# Patient Record
Sex: Male | Born: 1985 | Race: Black or African American | Hispanic: No | Marital: Single | Smoking: Never smoker
Health system: Southern US, Community
[De-identification: ages and names within clinical notes are randomized; demographics above are authoritative.]

## PROBLEM LIST (undated history)

## (undated) HISTORY — PX: LEG SURGERY: SHX1003

---
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
## 2005-07-02 ENCOUNTER — Ambulatory Visit: Payer: Self-pay | Admitting: Family Medicine

## 2006-05-02 ENCOUNTER — Ambulatory Visit: Payer: Self-pay | Admitting: Family Medicine

## 2006-09-29 DIAGNOSIS — E669 Obesity, unspecified: Secondary | ICD-10-CM | POA: Insufficient documentation

## 2007-05-01 ENCOUNTER — Ambulatory Visit: Payer: Self-pay

## 2007-07-03 ENCOUNTER — Encounter: Payer: Self-pay | Admitting: *Deleted

## 2008-04-03 ENCOUNTER — Ambulatory Visit: Payer: Self-pay | Admitting: Family Medicine

## 2008-04-03 ENCOUNTER — Encounter: Payer: Self-pay | Admitting: Family Medicine

## 2008-04-04 LAB — CONVERTED CEMR LAB
ALT: 63 units/L — ABNORMAL HIGH (ref 0–53)
AST: 29 units/L (ref 0–37)
Albumin: 4.7 g/dL (ref 3.5–5.2)
Alkaline Phosphatase: 54 units/L (ref 39–117)
BUN: 9 mg/dL (ref 6–23)
CO2: 25 meq/L (ref 19–32)
Calcium: 9.8 mg/dL (ref 8.4–10.5)
Chloride: 103 meq/L (ref 96–112)
Cholesterol: 157 mg/dL (ref 0–200)
Creatinine, Ser: 0.84 mg/dL (ref 0.40–1.50)
Glucose, Bld: 88 mg/dL (ref 70–99)
HDL: 47 mg/dL (ref >39)
LDL Cholesterol: 91 mg/dL (ref 0–99)
Potassium: 4.8 meq/L (ref 3.5–5.3)
Sodium: 139 meq/L (ref 135–145)
Total Bilirubin: 0.6 mg/dL (ref 0.3–1.2)
Total CHOL/HDL Ratio: 3.3
Total Protein: 7.8 g/dL (ref 6.0–8.3)
Triglycerides: 96 mg/dL (ref <150)
VLDL: 19 mg/dL (ref 0–40)

## 2008-04-22 ENCOUNTER — Ambulatory Visit: Payer: Self-pay | Admitting: Family Medicine

## 2014-10-19 ENCOUNTER — Emergency Department (HOSPITAL_COMMUNITY)
Admission: EM | Admit: 2014-10-19 | Discharge: 2014-10-19 | Disposition: A | Discharge status: Home / Self Care | Payer: BLUE CROSS/BLUE SHIELD | Attending: Emergency Medicine | Admitting: Emergency Medicine

## 2014-10-19 ENCOUNTER — Emergency Department (HOSPITAL_COMMUNITY): Payer: BLUE CROSS/BLUE SHIELD

## 2014-10-19 ENCOUNTER — Encounter (HOSPITAL_COMMUNITY): Payer: Self-pay | Admitting: Emergency Medicine

## 2014-10-19 DIAGNOSIS — Z23 Encounter for immunization: Secondary | ICD-10-CM | POA: Diagnosis not present

## 2014-10-19 DIAGNOSIS — Y9389 Activity, other specified: Secondary | ICD-10-CM | POA: Insufficient documentation

## 2014-10-19 DIAGNOSIS — M545 Low back pain, unspecified: Secondary | ICD-10-CM

## 2014-10-19 DIAGNOSIS — S3992XA Unspecified injury of lower back, initial encounter: Secondary | ICD-10-CM | POA: Diagnosis not present

## 2014-10-19 DIAGNOSIS — S99911A Unspecified injury of right ankle, initial encounter: Secondary | ICD-10-CM | POA: Insufficient documentation

## 2014-10-19 DIAGNOSIS — Y998 Other external cause status: Secondary | ICD-10-CM | POA: Diagnosis not present

## 2014-10-19 DIAGNOSIS — S161XXA Strain of muscle, fascia and tendon at neck level, initial encounter: Secondary | ICD-10-CM | POA: Insufficient documentation

## 2014-10-19 DIAGNOSIS — Y92410 Unspecified street and highway as the place of occurrence of the external cause: Secondary | ICD-10-CM | POA: Diagnosis not present

## 2014-10-19 DIAGNOSIS — S199XXA Unspecified injury of neck, initial encounter: Secondary | ICD-10-CM | POA: Diagnosis present

## 2014-10-19 MED ORDER — TETANUS-DIPHTH-ACELL PERTUSSIS 5-2.5-18.5 LF-MCG/0.5 IM SUSP
0.5000 mL | Freq: Once | INTRAMUSCULAR | Status: AC
  Administered 2014-10-19: 0.5 mL via INTRAMUSCULAR
  Filled 2014-10-19: qty 0.5

## 2014-10-19 MED ORDER — IBUPROFEN 800 MG PO TABS
800.0000 mg | ORAL_TABLET | Freq: Once | ORAL | Status: AC
  Administered 2014-10-19: 800 mg via ORAL
  Filled 2014-10-19: qty 1

## 2014-10-19 MED ORDER — MORPHINE SULFATE 4 MG/ML IJ SOLN
4.0000 mg | Freq: Once | INTRAMUSCULAR | Status: AC
  Administered 2014-10-19: 4 mg via INTRAMUSCULAR
  Filled 2014-10-19: qty 1

## 2014-10-19 MED ORDER — OXYCODONE-ACETAMINOPHEN 5-325 MG PO TABS: 1.0000 | ORAL_TABLET | ORAL | Status: DC | PRN

## 2014-10-19 NOTE — Progress Notes (Signed)
Orthopedic Tech Progress Note Patient Details:  Tony MedianDamone L Trujillo 10/11/1985 [id number] Patient ID: Tony Trujillo, male   DOB: 11/24/1985, 29 y.o.   MRN: [id number] Tony Trujillo, Tony Trujillo 10/19/2014, 9:33 PM Ok to apply by Dr. Rosalia Hammersay

## 2014-10-19 NOTE — ED Notes (Signed)
Signature pad not working. Pt verbalized understanding, all questions answered by RN.
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
## 2014-10-19 NOTE — Progress Notes (Signed)
Orthopedic Tech Progress Note Patient Details:  Tony Trujillo 04/12/1986 [id number]  Ortho Devices Type of Ortho Device: CAM walker Ortho Device/Splint Location: RLE Ortho Device/Splint Interventions: Ordered, Application   Jennye MoccasinHughes, [name] 10/19/2014, 9:28 PM

## 2014-10-19 NOTE — Progress Notes (Signed)
Orthopedic Tech Progress Note Patient Details:  Tony Trujillo 07/24/1986 [id number]  Ortho Devices Type of Ortho Device: Soft collar Ortho Device/Splint Location: neck Ortho Device/Splint Interventions: Ordered, Application   Tony Trujillo, Tony Trujillo 10/19/2014, 9:32 PM
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
## 2016-07-25 ENCOUNTER — Emergency Department (HOSPITAL_COMMUNITY)
Admission: EM | Admit: 2016-07-25 | Discharge: 2016-07-25 | Disposition: A | Discharge status: Home / Self Care | Payer: Self-pay | Attending: Emergency Medicine | Admitting: Emergency Medicine

## 2016-07-25 ENCOUNTER — Emergency Department (HOSPITAL_COMMUNITY): Payer: Self-pay

## 2016-07-25 ENCOUNTER — Encounter (HOSPITAL_COMMUNITY): Payer: Self-pay | Admitting: Nurse Practitioner

## 2016-07-25 DIAGNOSIS — R55 Syncope and collapse: Secondary | ICD-10-CM | POA: Insufficient documentation

## 2016-07-25 LAB — CBC WITH DIFFERENTIAL/PLATELET
Basophils Absolute: 0 10*3/uL (ref 0.0–0.1)
Basophils Relative: 0 %
Eosinophils Absolute: 0.1 10*3/uL (ref 0.0–0.7)
Eosinophils Relative: 0 %
HCT: 46.7 % (ref 39.0–52.0)
Hemoglobin: 16.2 g/dL (ref 13.0–17.0)
Lymphocytes Relative: 13 %
Lymphs Abs: 1.9 10*3/uL (ref 0.7–4.0)
MCH: 31.3 pg (ref 26.0–34.0)
MCHC: 34.7 g/dL (ref 30.0–36.0)
MCV: 90.3 fL (ref 78.0–100.0)
Monocytes Absolute: 1.1 10*3/uL — ABNORMAL HIGH (ref 0.1–1.0)
Monocytes Relative: 7 %
Neutro Abs: 11.7 10*3/uL — ABNORMAL HIGH (ref 1.7–7.7)
Neutrophils Relative %: 80 %
Platelets: 350 10*3/uL (ref 150–400)
RBC: 5.17 MIL/uL (ref 4.22–5.81)
RDW: 12.3 % (ref 11.5–15.5)
WBC: 14.8 10*3/uL — ABNORMAL HIGH (ref 4.0–10.5)

## 2016-07-25 LAB — BASIC METABOLIC PANEL
Anion gap: 8 (ref 5–15)
BUN: 11 mg/dL (ref 6–20)
CO2: 24 mmol/L (ref 22–32)
Calcium: 9.1 mg/dL (ref 8.9–10.3)
Chloride: 105 mmol/L (ref 101–111)
Creatinine, Ser: 1.01 mg/dL (ref 0.61–1.24)
GFR calc Af Amer: >60 mL/min (ref >60)
GFR calc non Af Amer: >60 mL/min (ref >60)
Glucose, Bld: 82 mg/dL (ref 65–99)
Potassium: 3.7 mmol/L (ref 3.5–5.1)
Sodium: 137 mmol/L (ref 135–145)

## 2016-07-25 LAB — D-DIMER, QUANTITATIVE: D-Dimer, Quant: 1.62 ug/mL-FEU — ABNORMAL HIGH (ref 0.00–0.50)

## 2016-07-25 MED ORDER — SODIUM CHLORIDE 0.9 % IV BOLUS (SEPSIS)
1000.0000 mL | Freq: Once | INTRAVENOUS | Status: AC
  Administered 2016-07-25: 1000 mL via INTRAVENOUS

## 2016-07-25 MED ORDER — IOPAMIDOL (ISOVUE-370) INJECTION 76%
100.0000 mL | Freq: Once | INTRAVENOUS | Status: AC | PRN
  Administered 2016-07-25: 100 mL via INTRAVENOUS

## 2016-07-25 MED ORDER — IOPAMIDOL (ISOVUE-370) INJECTION 76%
INTRAVENOUS | Status: AC
  Administered 2016-07-25: 100 mL via INTRAVENOUS
  Administered 2016-07-25: 18:00:00
  Filled 2016-07-25: qty 100

## 2016-07-25 MED ORDER — ACETAMINOPHEN 500 MG PO TABS
1000.0000 mg | ORAL_TABLET | Freq: Once | ORAL | Status: AC
  Administered 2016-07-25: 1000 mg via ORAL
  Filled 2016-07-25: qty 2
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
## 2016-10-10 ENCOUNTER — Emergency Department (HOSPITAL_COMMUNITY)
Admission: EM | Admit: 2016-10-10 | Discharge: 2016-10-10 | Disposition: A | Discharge status: Home / Self Care | Payer: BLUE CROSS/BLUE SHIELD | Attending: Emergency Medicine | Admitting: Emergency Medicine

## 2016-10-10 ENCOUNTER — Encounter (HOSPITAL_COMMUNITY): Payer: Self-pay | Admitting: Emergency Medicine

## 2016-10-10 ENCOUNTER — Emergency Department (HOSPITAL_COMMUNITY): Payer: BLUE CROSS/BLUE SHIELD

## 2016-10-10 DIAGNOSIS — Z79899 Other long term (current) drug therapy: Secondary | ICD-10-CM | POA: Insufficient documentation

## 2016-10-10 DIAGNOSIS — R1013 Epigastric pain: Secondary | ICD-10-CM | POA: Insufficient documentation

## 2016-10-10 DIAGNOSIS — R1010 Upper abdominal pain, unspecified: Secondary | ICD-10-CM

## 2016-10-10 DIAGNOSIS — R748 Abnormal levels of other serum enzymes: Secondary | ICD-10-CM | POA: Insufficient documentation

## 2016-10-10 LAB — HEPATIC FUNCTION PANEL
ALT: 23 U/L (ref 17–63)
AST: 20 U/L (ref 15–41)
Albumin: 4.1 g/dL (ref 3.5–5.0)
Alkaline Phosphatase: 34 U/L — ABNORMAL LOW (ref 38–126)
BILIRUBIN TOTAL: 0.3 mg/dL (ref 0.3–1.2)
Total Protein: 7.3 g/dL (ref 6.5–8.1)

## 2016-10-10 LAB — I-STAT TROPONIN, ED: TROPONIN I, POC: 0 ng/mL (ref 0.00–0.08)

## 2016-10-10 LAB — BASIC METABOLIC PANEL
Anion gap: 5 (ref 5–15)
BUN: 11 mg/dL (ref 6–20)
CALCIUM: 9 mg/dL (ref 8.9–10.3)
CO2: 24 mmol/L (ref 22–32)
Chloride: 106 mmol/L (ref 101–111)
Creatinine, Ser: 0.8 mg/dL (ref 0.61–1.24)
GFR calc Af Amer: >60 mL/min (ref >60)
GLUCOSE: 112 mg/dL — AB (ref 65–99)
Potassium: 3.6 mmol/L (ref 3.5–5.1)
Sodium: 135 mmol/L (ref 135–145)

## 2016-10-10 LAB — CBC
HCT: 41 % (ref 39.0–52.0)
Hemoglobin: 13.9 g/dL (ref 13.0–17.0)
MCH: 30.7 pg (ref 26.0–34.0)
MCHC: 33.9 g/dL (ref 30.0–36.0)
MCV: 90.5 fL (ref 78.0–100.0)
Platelets: 287 10*3/uL (ref 150–400)
RBC: 4.53 MIL/uL (ref 4.22–5.81)
RDW: 12.5 % (ref 11.5–15.5)
WBC: 7.5 10*3/uL (ref 4.0–10.5)

## 2016-10-10 LAB — URINALYSIS, ROUTINE W REFLEX MICROSCOPIC
Bilirubin Urine: NEGATIVE
GLUCOSE, UA: NEGATIVE mg/dL
HGB URINE DIPSTICK: NEGATIVE
Ketones, ur: NEGATIVE mg/dL
Leukocytes, UA: NEGATIVE
Nitrite: NEGATIVE
PH: 5 (ref 5.0–8.0)
PROTEIN: NEGATIVE mg/dL
Specific Gravity, Urine: 1.017 (ref 1.005–1.030)

## 2016-10-10 LAB — LIPASE, BLOOD: Lipase: 118 U/L — ABNORMAL HIGH (ref 11–51)

## 2016-10-10 MED ORDER — ALUM & MAG HYDROXIDE-SIMETH 200-200-20 MG/5ML PO SUSP
30.0000 mL | Freq: Once | ORAL | Status: AC
  Administered 2016-10-10: 30 mL via ORAL
  Filled 2016-10-10: qty 30

## 2016-10-10 MED ORDER — FAMOTIDINE 20 MG PO TABS
20.0000 mg | ORAL_TABLET | Freq: Once | ORAL | Status: AC
  Administered 2016-10-10: 20 mg via ORAL
  Filled 2016-10-10: qty 1

## 2016-10-10 MED ORDER — ACETAMINOPHEN 500 MG PO TABS
1000.0000 mg | ORAL_TABLET | Freq: Once | ORAL | Status: AC
  Administered 2016-10-10: 1000 mg via ORAL
  Filled 2016-10-10: qty 2

## 2016-10-10 NOTE — ED Notes (Signed)
Patient has been given urinal and told to call out when sample it ready.

## 2016-10-10 NOTE — ED Notes (Signed)
Patient denies pain and is resting comfortably.  

## 2016-10-10 NOTE — ED Triage Notes (Signed)
Pt c/o constant tight pain from lower chest to lower abdomen onset this morning, some SOB and chest tightness worse with ambulation on waking that resolved. No n/v/diarrhea.

## 2016-10-10 NOTE — ED Provider Notes (Signed)
[facility]-EMERGENCY DEPT Provider Note   CSN: [id number] Arrival date & time: 10/10/16  0803     History   Chief Complaint Chief Complaint  Patient presents with  . Abdominal Pain    HPI Tony Trujillo is a 31 y.o. male.  Patient c/o abdominal pain, from epigastric area to mid abd onset this AM.  Gradual onset, dull, moderate, non radiating. No hx same pain. w pain, no specific exacerbating or alleviating factors. No back or flank pain. No chest pain or sob. No hx pud, pancreatitis, or gallstones. No prior abd surgery. Had normal bm today. No dysuria, hematuria or gu c/o. No fever or chills.    The history is provided by the patient.    History reviewed. No pertinent past medical history.  Patient Active Problem List   Diagnosis Date Noted  . OBESITY, NOS 09/29/2006    History reviewed. No pertinent surgical history.     Home Medications    Prior to Admission medications   Medication Sig Start Date End Date Taking? Authorizing Provider  ibuprofen (ADVIL,MOTRIN) 200 MG tablet Take 200-600 mg by mouth every 6 (six) hours as needed for headache, mild pain or moderate pain.    Yes Historical Provider, MD    Family History History reviewed. No pertinent family history.  Social History Social History  Substance Use Topics  . Smoking status: Never Smoker  . Smokeless tobacco: Never Used  . Alcohol use No     Allergies   Patient has no known allergies.   Review of Systems Review of Systems  Constitutional: Negative for fever.  HENT: Negative for sore throat.   Eyes: Negative for redness.  Respiratory: Negative for shortness of breath.   Cardiovascular: Negative for chest pain and leg swelling.  Gastrointestinal: Positive for abdominal pain. Negative for constipation, diarrhea and vomiting.  Genitourinary: Negative for dysuria and flank pain.  Musculoskeletal: Negative for back pain and neck pain.  Skin: Negative for rash.  Neurological: Negative for headaches.    Hematological: Does not bruise/bleed easily.  Psychiatric/Behavioral: Negative for confusion.     Physical Exam Updated Vital Signs BP 103/79 (BP Location: Left Arm)   Pulse 76   Temp 98.2 F (36.8 C) (Oral)   Resp 16   Wt (!) 138.8 kg   SpO2 100%   BMI 41.50 kg/m   Physical Exam  Constitutional: He appears well-developed and well-nourished. No distress.  HENT:  Mouth/Throat: Oropharynx is clear and moist.  Eyes: Conjunctivae are normal.  Neck: Neck supple. No tracheal deviation present.  Cardiovascular: Normal rate, regular rhythm, normal heart sounds and intact distal pulses.  Exam reveals no gallop and no friction rub.   No murmur heard. Pulmonary/Chest: Effort normal and breath sounds normal. No accessory muscle usage. No respiratory distress.  Abdominal: Soft. Bowel sounds are normal. He exhibits no distension and no mass. There is tenderness. There is no rebound and no guarding. No hernia.  Mild epigastric tenderness  Genitourinary:  Genitourinary Comments: No cva tenderness  Musculoskeletal: He exhibits no edema or tenderness.  Neurological: He is alert.  Skin: Skin is warm and dry. He is not diaphoretic.  Psychiatric: He has a normal mood and affect.  Nursing note and vitals reviewed.    ED Treatments / Results  Labs (all labs ordered are listed, but only abnormal results are displayed) Results for orders placed or performed during the hospital encounter of 10/10/16  Basic metabolic panel  Result Value Ref Range   Sodium 135 135 -  145 mmol/L   Potassium 3.6 3.5 - 5.1 mmol/L   Chloride 106 101 - 111 mmol/L   CO2 24 22 - 32 mmol/L   Glucose, Bld 112 (H) 65 - 99 mg/dL   BUN 11 6 - 20 mg/dL   Creatinine, Ser 1.61 0.61 - 1.24 mg/dL   Calcium 9.0 8.9 - 09.6 mg/dL   GFR calc non Af Amer >60 >60 mL/min   GFR calc Af Amer >60 >60 mL/min   Anion gap 5 5 - 15  CBC  Result Value Ref Range   WBC 7.5 4.0 - 10.5 K/uL   RBC 4.53 4.22 - 5.81 MIL/uL   Hemoglobin  13.9 13.0 - 17.0 g/dL   HCT 04.5 40.9 - 81.1 %   MCV 90.5 78.0 - 100.0 fL   MCH 30.7 26.0 - 34.0 pg   MCHC 33.9 30.0 - 36.0 g/dL   RDW 91.4 78.2 - 95.6 %   Platelets 287 150 - 400 K/uL  Urinalysis, Routine w reflex microscopic  Result Value Ref Range   Color, Urine YELLOW YELLOW   APPearance CLEAR CLEAR   Specific Gravity, Urine 1.017 1.005 - 1.030   pH 5.0 5.0 - 8.0   Glucose, UA NEGATIVE NEGATIVE mg/dL   Hgb urine dipstick NEGATIVE NEGATIVE   Bilirubin Urine NEGATIVE NEGATIVE   Ketones, ur NEGATIVE NEGATIVE mg/dL   Protein, ur NEGATIVE NEGATIVE mg/dL   Nitrite NEGATIVE NEGATIVE   Leukocytes, UA NEGATIVE NEGATIVE  Hepatic function panel  Result Value Ref Range   Total Protein 7.3 6.5 - 8.1 g/dL   Albumin 4.1 3.5 - 5.0 g/dL   AST 20 15 - 41 U/L   ALT 23 17 - 63 U/L   Alkaline Phosphatase 34 (L) 38 - 126 U/L   Total Bilirubin 0.3 0.3 - 1.2 mg/dL   Bilirubin, Direct <2.1 (L) 0.1 - 0.5 mg/dL   Indirect Bilirubin NOT CALCULATED 0.3 - 0.9 mg/dL  Lipase, blood  Result Value Ref Range   Lipase 118 (H) 11 - 51 U/L  I-stat troponin, ED  Result Value Ref Range   Troponin i, poc 0.00 0.00 - 0.08 ng/mL   Comment 3           Dg Chest 2 View  Result Date: 10/10/2016 CLINICAL DATA:  Chest pain EXAM: CHEST  2 VIEW COMPARISON:  07/25/2016 FINDINGS: The heart size and mediastinal contours are within normal limits. Both lungs are clear. The visualized skeletal structures are unremarkable. IMPRESSION: No active cardiopulmonary disease. Electronically Signed   By: Alcide Clever M.D.   On: 10/10/2016 08:44    EKG  EKG Interpretation  Date/Time:  Sunday October 10 2016 08:15:19 EDT Ventricular Rate:  81 PR Interval:    QRS Duration: 85 QT Interval:  345 QTC Calculation: 401 R Axis:   17 Text Interpretation:  Sinus rhythm No significant change since last tracing Confirmed by Denton Lank  MD, Caryn Bee ([id number]) on 10/10/2016 8:24:11 AM       Radiology Dg Chest 2 View  Result Date:  10/10/2016 CLINICAL DATA:  Chest pain EXAM: CHEST  2 VIEW COMPARISON:  07/25/2016 FINDINGS: The heart size and mediastinal contours are within normal limits. Both lungs are clear. The visualized skeletal structures are unremarkable. IMPRESSION: No active cardiopulmonary disease. Electronically Signed   By: Alcide Clever M.D.   On: 10/10/2016 08:44    Procedures Procedures (including critical care time)  Medications Ordered in ED Medications  famotidine (PEPCID) tablet 20 mg (not administered)  acetaminophen (TYLENOL)  tablet 1,000 mg (not administered)  alum & mag hydroxide-simeth (MAALOX/MYLANTA) 200-200-20 MG/5ML suspension 30 mL (not administered)     Initial Impression / Assessment and Plan / ED Course  I have reviewed the triage vital signs and the nursing notes.  Pertinent labs & imaging results that were available during my care of the patient were reviewed by me and considered in my medical decision making (see chart for details).  Labs sent.  Tylenol, pepcid, and maalox given for symptom relief.  Reviewed nursing notes and prior charts for additional history.   Patient with resolution symptoms.  From labs, lipase is mildly elevated. Current no abd or back pain.   Today, clear liquids, advance as tolerated.   Rec pcp f/u.      Final Clinical Impressions(s) / ED Diagnoses   Final diagnoses:  None    New Prescriptions New Prescriptions   No medications on file     Cathren LaineKevin Fama Muenchow, MD 10/10/16 1227

## 2016-10-10 NOTE — Discharge Instructions (Signed)
It was our pleasure to provide your ER care today - we hope that you feel better.  Rest. Drink adequate fluids.  Clear liquid diet today, then advance as tolerated.  You may take acetaminophen or ibuprofen as need.  If gi symptoms, you may also try pepcid and/or maalox as need.   From today's labs, one of your pancreas tests is mildly elevated (lipase 118) - follow up with primary care doctor.  Follow up with your doctor in the next 1-2 days if symptoms fail to improve/resolve.  Return to ER right away if worse, new symptoms, fevers, worsening or severe abdominal pain, persistent vomiting, other concern.

## 2021-01-17 ENCOUNTER — Encounter (HOSPITAL_BASED_OUTPATIENT_CLINIC_OR_DEPARTMENT_OTHER): Payer: Self-pay | Admitting: *Deleted

## 2021-01-17 ENCOUNTER — Emergency Department (HOSPITAL_BASED_OUTPATIENT_CLINIC_OR_DEPARTMENT_OTHER): Payer: BLUE CROSS/BLUE SHIELD

## 2021-01-17 ENCOUNTER — Other Ambulatory Visit: Payer: Self-pay

## 2021-01-17 ENCOUNTER — Emergency Department (HOSPITAL_BASED_OUTPATIENT_CLINIC_OR_DEPARTMENT_OTHER)
Admission: EM | Admit: 2021-01-17 | Discharge: 2021-01-17 | Disposition: A | Discharge status: Home / Self Care | Payer: BLUE CROSS/BLUE SHIELD | Attending: Emergency Medicine | Admitting: Emergency Medicine

## 2021-01-17 DIAGNOSIS — W1789XA Other fall from one level to another, initial encounter: Secondary | ICD-10-CM | POA: Insufficient documentation

## 2021-01-17 DIAGNOSIS — S92351A Displaced fracture of fifth metatarsal bone, right foot, initial encounter for closed fracture: Secondary | ICD-10-CM | POA: Insufficient documentation

## 2021-01-17 DIAGNOSIS — T1490XA Injury, unspecified, initial encounter: Secondary | ICD-10-CM

## 2021-01-17 MED ORDER — OXYCODONE HCL 5 MG PO TABS: 5.0000 mg | ORAL_TABLET | ORAL | 0 refills | Status: AC | PRN

## 2021-01-17 MED ORDER — OXYCODONE-ACETAMINOPHEN 5-325 MG PO TABS
1.0000 | ORAL_TABLET | Freq: Once | ORAL | Status: AC
  Administered 2021-01-17: 1 via ORAL
  Filled 2021-01-17: qty 1

## 2021-01-17 MED ORDER — IBUPROFEN 400 MG PO TABS
600.0000 mg | ORAL_TABLET | Freq: Once | ORAL | Status: AC
  Administered 2021-01-17: 600 mg via ORAL
  Filled 2021-01-17: qty 1

## 2021-01-17 NOTE — Discharge Instructions (Signed)
Do not bear weight on your right leg.  Follow-up with the orthopedist either on 6/20 or 6/22.  Call on 6/20 for an appointment.

## 2021-01-17 NOTE — ED Provider Notes (Signed)
[facility] EMERGENCY DEPT Provider Note   CSN: [id number] Arrival date & time: 01/17/21  1515     History Chief Complaint  Patient presents with   Foot Injury    Tony Trujillo is a 35 y.o. male.  HPI 35 year old male presents with right foot injury.  Yesterday he was getting off of a trailer and the stepstool going to the trailer broke and he twisted his right foot.  Has been unable to bear weight on it since and has to slide around.  No weakness or numbness but is swollen and painful.  Took some ibuprofen yesterday.  History reviewed. No pertinent past medical history.  Patient Active Problem List   Diagnosis Date Noted   OBESITY, NOS 09/29/2006    Past Surgical History:  Procedure Laterality Date   LEG SURGERY Left        No family history on file.  Social History   Tobacco Use   Smoking status: Never   Smokeless tobacco: Never  Substance Use Topics   Alcohol use: No   Drug use: Never    Home Medications Prior to Admission medications   Medication Sig Start Date End Date Taking? Authorizing Provider  oxyCODONE (ROXICODONE) 5 MG immediate release tablet Take 1-2 tablets (5-10 mg total) by mouth every 4 (four) hours as needed for severe pain. 01/17/21  Yes Pricilla Loveless, MD  ibuprofen (ADVIL,MOTRIN) 200 MG tablet Take 200-600 mg by mouth every 6 (six) hours as needed for headache, mild pain or moderate pain.     [provider]    Allergies    Patient has no known allergies.  Review of Systems   Review of Systems  Musculoskeletal:  Positive for arthralgias and joint swelling.  Neurological:  Negative for weakness and numbness.   Physical Exam Updated Vital Signs BP 122/69 (BP Location: Left Arm)   Pulse 84   Temp 98.4 F (36.9 C) (Oral)   Resp 17   Ht 6' (1.829 m)   Wt 126.6 kg   SpO2 96%   BMI 37.84 kg/m   Physical Exam Vitals and nursing note reviewed.  Constitutional:      Appearance: He is well-developed.  HENT:      Head: Normocephalic and atraumatic.     Right Ear: External ear normal.     Left Ear: External ear normal.     Nose: Nose normal.  Eyes:     General:        Right eye: No discharge.        Left eye: No discharge.  Cardiovascular:     Rate and Rhythm: Normal rate and regular rhythm.     Pulses:          Dorsalis pedis pulses are 2+ on the right side.  Pulmonary:     Effort: Pulmonary effort is normal.  Abdominal:     General: There is no distension.  Musculoskeletal:     Cervical back: Neck supple.     Right ankle: No swelling or ecchymosis. No tenderness. Normal range of motion.     Right foot: Swelling and tenderness (lateral) present. No deformity.  Skin:    General: Skin is warm and dry.  Neurological:     Mental Status: He is alert.  Psychiatric:        Mood and Affect: Mood is not anxious.    ED Results / Procedures / Treatments   Labs (all labs ordered are listed, but only abnormal results are displayed) Labs  Reviewed - No data to display  EKG None  Radiology DG Foot Complete Right  Result Date: 01/17/2021 CLINICAL DATA:  Twisted right foot at work. EXAM: RIGHT FOOT COMPLETE - 3+ VIEW COMPARISON:  None. FINDINGS: There is a mildly comminuted and displaced fracture of the base of the right fifth metatarsal bone. Associated soft tissue swelling. IMPRESSION: Mildly comminuted and displaced fracture of the base of the right fifth metatarsal bone. Electronically Signed   By: Ted Mcalpine M.D.   On: 01/17/2021 17:17    Procedures Procedures   Medications Ordered in ED Medications  ibuprofen (ADVIL) tablet 600 mg (600 mg Oral Given 01/17/21 1805)  oxyCODONE-acetaminophen (PERCOCET/ROXICET) 5-325 MG per tablet 1 tablet (1 tablet Oral Given 01/17/21 1805)    ED Course  I have reviewed the triage vital signs and the nursing notes.  Pertinent labs & imaging results that were available during my care of the patient were reviewed by me and considered in my  medical decision making (see chart for details).    MDM Rules/Calculators/A&P                          Patient's x-rays have been reviewed and shows a proximal fifth metatarsal fracture.  Neurovascular intact.  We will give pain control and put in splint and give crutches.  We discussed no weightbearing.  Dr. Dion Saucier would like to see either on 6/20 or 6/22 in his office. Final Clinical Impression(s) / ED Diagnoses Final diagnoses:  Injury  Closed displaced fracture of fifth metatarsal bone of right foot, initial encounter    Rx / DC Orders ED Discharge Orders          Ordered    oxyCODONE (ROXICODONE) 5 MG immediate release tablet  Every 4 hours PRN        01/17/21 1808             Pricilla Loveless, MD 01/17/21 1817

## 2021-01-17 NOTE — ED Triage Notes (Signed)
Pt was stepping out of a freezer trailer yesterday, step gave way, pt twisted rt ankle as he landed on ground.Pain is more on top and lat foot today.

## 2021-06-28 ENCOUNTER — Other Ambulatory Visit: Payer: Self-pay

## 2021-06-28 ENCOUNTER — Encounter (HOSPITAL_BASED_OUTPATIENT_CLINIC_OR_DEPARTMENT_OTHER): Payer: Self-pay

## 2021-06-28 ENCOUNTER — Emergency Department (HOSPITAL_BASED_OUTPATIENT_CLINIC_OR_DEPARTMENT_OTHER)
Admission: EM | Admit: 2021-06-28 | Discharge: 2021-06-28 | Disposition: A | Discharge status: Home / Self Care | Payer: Self-pay | Attending: Emergency Medicine | Admitting: Emergency Medicine

## 2021-06-28 DIAGNOSIS — B349 Viral infection, unspecified: Secondary | ICD-10-CM | POA: Insufficient documentation

## 2021-06-28 DIAGNOSIS — Z20822 Contact with and (suspected) exposure to covid-19: Secondary | ICD-10-CM | POA: Insufficient documentation

## 2021-06-28 DIAGNOSIS — Z28310 Unvaccinated for covid-19: Secondary | ICD-10-CM | POA: Insufficient documentation

## 2021-06-28 LAB — RESP PANEL BY RT-PCR (FLU A&B, COVID) ARPGX2
Influenza A by PCR: NEGATIVE
Influenza B by PCR: NEGATIVE
SARS Coronavirus 2 by RT PCR: NEGATIVE

## 2021-06-28 NOTE — Discharge Instructions (Signed)
Likely a viral infection, recommend over-the-counter pain medications like ibuprofen Tylenol for fever and pain control, nasal decongestions like Flonase and Zyrtec, Mucinex for cough.  If not eating recommend supplementing with Gatorade to help with electrolyte supplementation.  Your COVID and your influenza came back negative.  Follow-up PCP for further evaluation.  Come back to the emergency department if you develop chest pain, shortness of breath, severe abdominal pain, uncontrolled nausea, vomiting, diarrhea.

## 2021-06-28 NOTE — ED Provider Notes (Signed)
[place] EMERGENCY DEPT Provider Note   CSN: [id number] Arrival date & time: 06/28/21  1723     History Chief Complaint  Patient presents with   Cough   Fatigue    Tony Trujillo is a 35 y.o. male.  HPI  Patient with no significant history presents with complaints of URI-like symptoms.  He endorses symptoms started on Friday and have slightly gotten better.  He endorses subjective fevers, chills, nasal congestion, productive cough, chest pain shortness of breath only while coughing, and general body aches.  He denies stomach pain, nausea, vomit, diarrhea, denies throat pain, decrease in appetite, is tolerating p.o. without difficulty.  He is not immunocompromise, has received his COVID-vaccine has not gotten his flu vaccine, states that his coworkers have been sick with a viral-like illness.  He denies  alleviating or aggravating factors.  Has no other complaints.  History reviewed. No pertinent past medical history.  Patient Active Problem List   Diagnosis Date Noted   OBESITY, NOS 09/29/2006    Past Surgical History:  Procedure Laterality Date   LEG SURGERY Left        No family history on file.  Social History   Tobacco Use   Smoking status: Never   Smokeless tobacco: Never  Substance Use Topics   Alcohol use: No   Drug use: Never    Home Medications Prior to Admission medications   Medication Sig Start Date End Date Taking? Authorizing Provider  ibuprofen (ADVIL,MOTRIN) 200 MG tablet Take 200-600 mg by mouth every 6 (six) hours as needed for headache, mild pain or moderate pain.     [provider]  oxyCODONE (ROXICODONE) 5 MG immediate release tablet Take 1-2 tablets (5-10 mg total) by mouth every 4 (four) hours as needed for severe pain. 01/17/21   Sherwood Gambler, MD    Allergies    Patient has no known allergies.  Review of Systems   Review of Systems  Constitutional:  Positive for chills and fever.  HENT:  Positive for  congestion. Negative for sore throat.   Eyes:  Negative for visual disturbance.  Respiratory:  Positive for cough. Negative for shortness of breath.   Cardiovascular:  Negative for chest pain.  Gastrointestinal:  Negative for abdominal pain, diarrhea, nausea and vomiting.  Genitourinary:  Negative for enuresis.  Musculoskeletal:  Positive for myalgias. Negative for back pain.  Skin:  Negative for rash.  Neurological:  Negative for dizziness and headaches.  Hematological:  Does not bruise/bleed easily.   Physical Exam Updated Vital Signs BP (!) 141/79 (BP Location: Right Arm)   Pulse 82   Temp 98.3 F (36.8 C)   Resp 19   SpO2 97%   Physical Exam Vitals and nursing note reviewed.  Constitutional:      General: He is not in acute distress.    Appearance: He is not ill-appearing.  HENT:     Head: Normocephalic and atraumatic.     Nose: Congestion present.     Mouth/Throat:     Mouth: Mucous membranes are moist.     Pharynx: Oropharynx is clear. No oropharyngeal exudate or posterior oropharyngeal erythema.  Eyes:     Conjunctiva/sclera: Conjunctivae normal.  Cardiovascular:     Rate and Rhythm: Normal rate and regular rhythm.     Pulses: Normal pulses.     Heart sounds: No murmur heard.   No friction rub. No gallop.  Pulmonary:     Effort: No respiratory distress.     Breath  sounds: No wheezing, rhonchi or rales.  Abdominal:     Palpations: Abdomen is soft.     Tenderness: There is no right CVA tenderness or left CVA tenderness.  Musculoskeletal:     Right lower leg: No edema.     Left lower leg: No edema.  Skin:    General: Skin is warm and dry.  Neurological:     Mental Status: He is alert.  Psychiatric:        Mood and Affect: Mood normal.    ED Results / Procedures / Treatments   Labs (all labs ordered are listed, but only abnormal results are displayed) Labs Reviewed  RESP PANEL BY RT-PCR (FLU A&B, COVID) [id number]    EKG None  Radiology No results  found.  Procedures Procedures   Medications Ordered in ED Medications - No data to display  ED Course  I have reviewed the triage vital signs and the nursing notes.  Pertinent labs & imaging results that were available during my care of the patient were reviewed by me and considered in my medical decision making (see chart for details).    MDM Rules/Calculators/A&P                          Initial impression-presents with URI-like symptoms, alert, no acute stress, vital signs reassuring.  Triage obtain respiratory panel.  Work-up-respiratory panel negative for acute findings.  Rule out- Low suspicion for systemic infection as patient is nontoxic-appearing, vital signs reassuring, no obvious source infection noted on exam.  Low suspicion for pneumonia as lung sounds are clear bilaterally, will defer imaging at this time.  I have low suspicion for PE as patient denies pleuritic chest pain, shortness of breath, patient is PERC. low suspicion for strep throat as oropharynx was visualized, no erythema or exudates noted.  Low suspicion patient would need  hospitalized due to viral infection or Covid as vital signs reassuring, patient is not in respiratory distress.    Plan-  URI-likely viral, will recommend symptom management, follow-up PCP for further evaluation.  Gave strict return precautions.  Vital signs have remained stable, no indication for hospital admission.    Patient given at home care as well strict return precautions.  Patient verbalized that they understood agreed to said plan.  Final Clinical Impression(s) / ED Diagnoses Final diagnoses:  Viral illness    Rx / DC Orders ED Discharge Orders     None        Carroll Sage, PA-C 06/28/21 1952    Gwyneth Sprout, MD 07/02/21 1335

## 2021-06-28 NOTE — ED Triage Notes (Signed)
Pt presents with a cough, dizziness and weakness starting Friday. Taking cough syrup x1 day w/no relieve. Highest temp 99.3 at home.  Pt works at [employer]

## 2022-10-23 IMAGING — DX DG FOOT COMPLETE 3+V*R*
2 series · 3 of 3 positions shown · non-contrast
Comparison: None.

CLINICAL DATA: Twisted right foot at work.

EXAM:
RIGHT FOOT COMPLETE - 3+ VIEW

[Series 1: foot · 0.14mm/px · 2 of 2 slices shown]
[im 1/2]
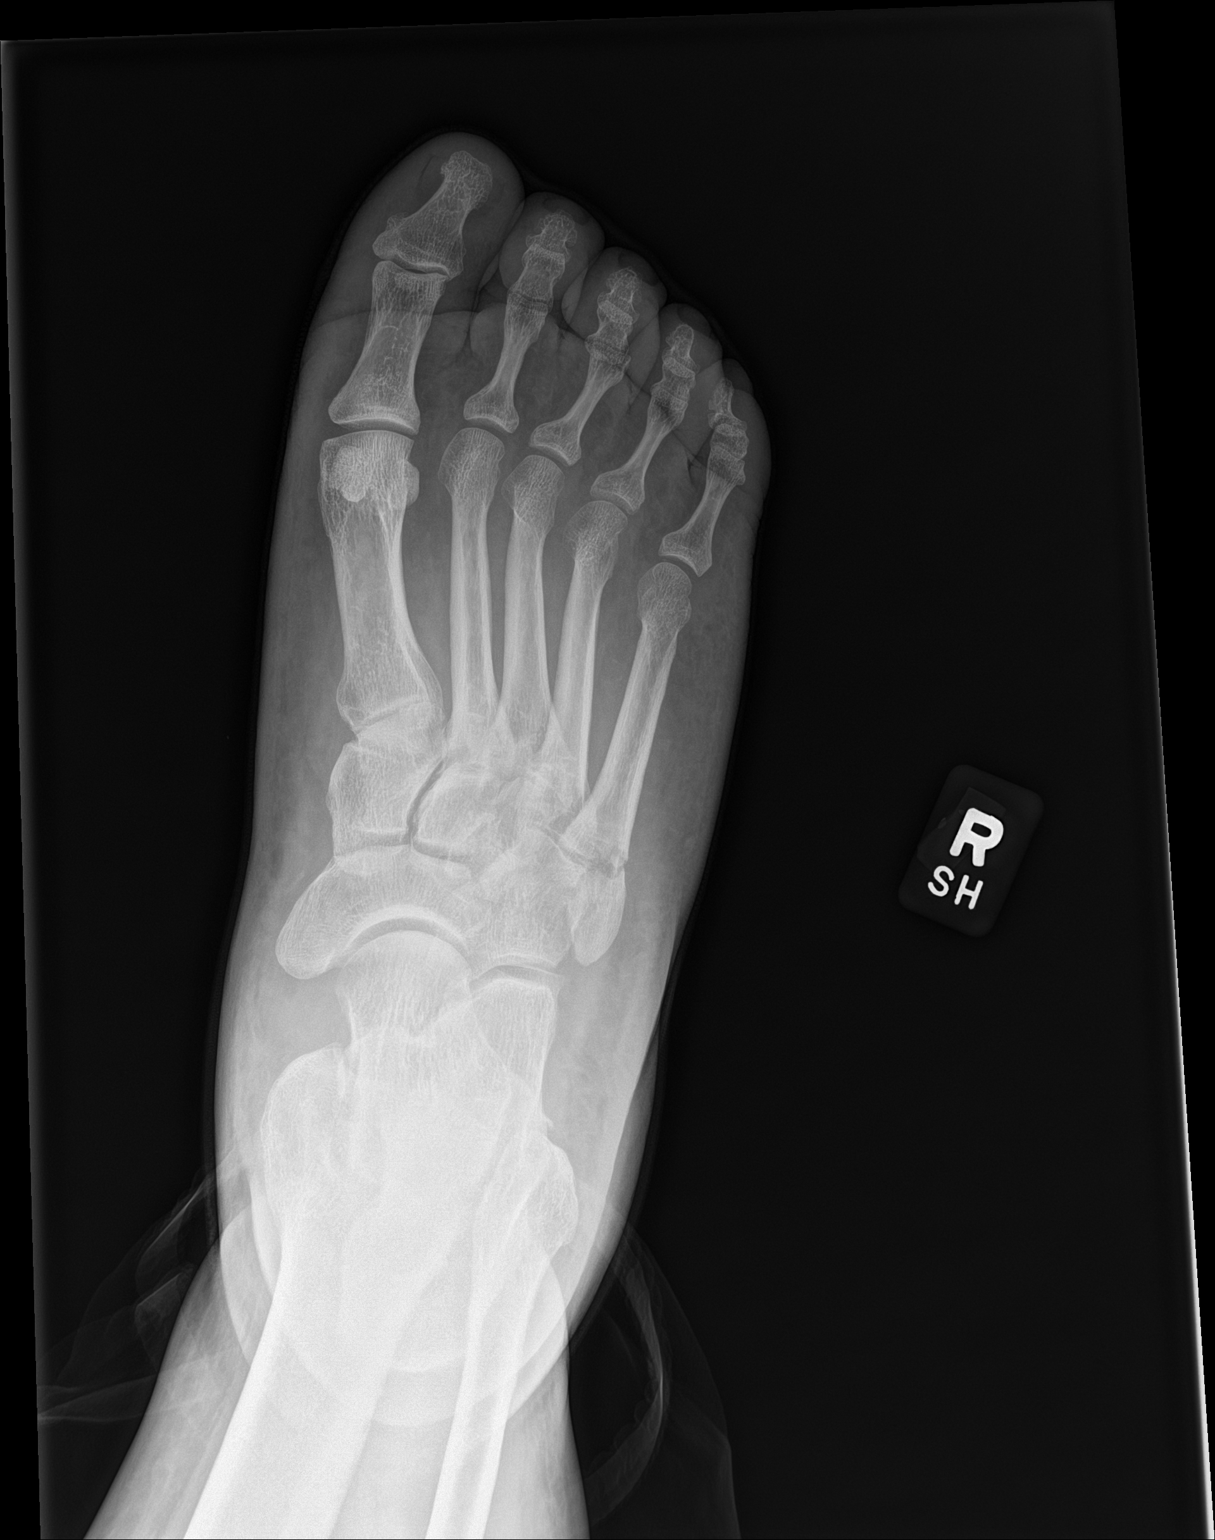
[im 2/2]
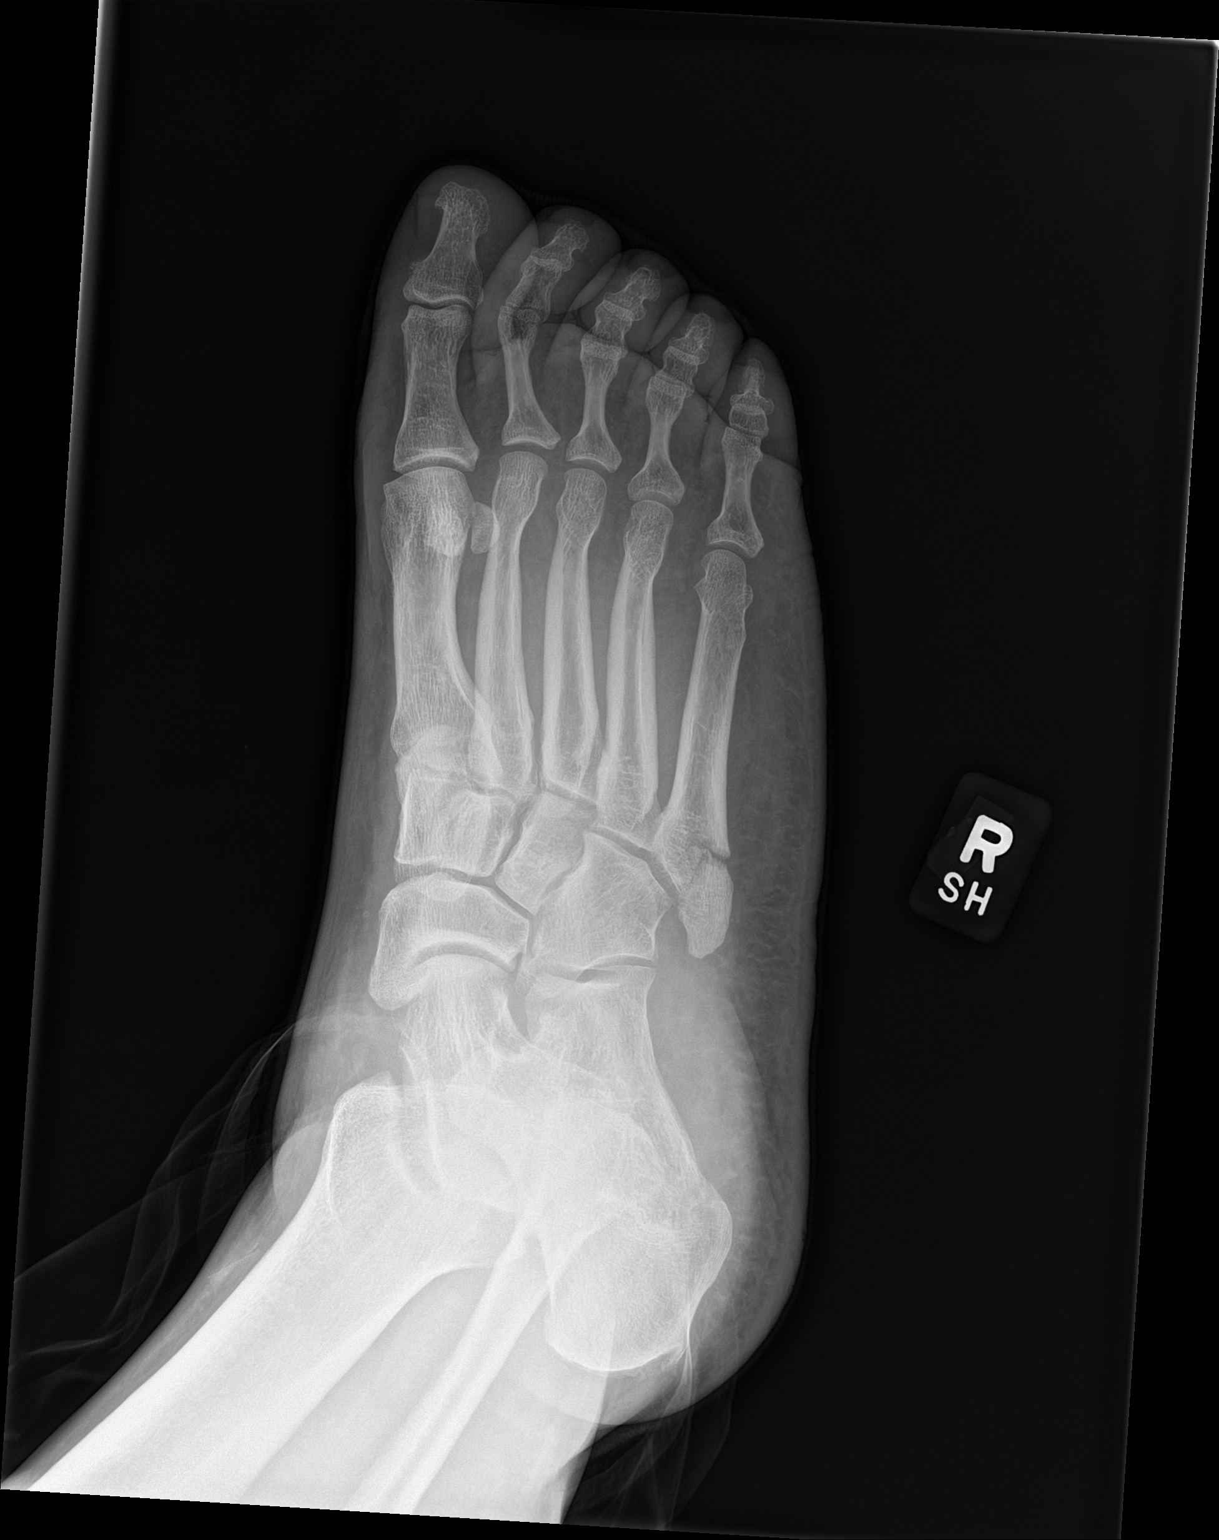

[leg]
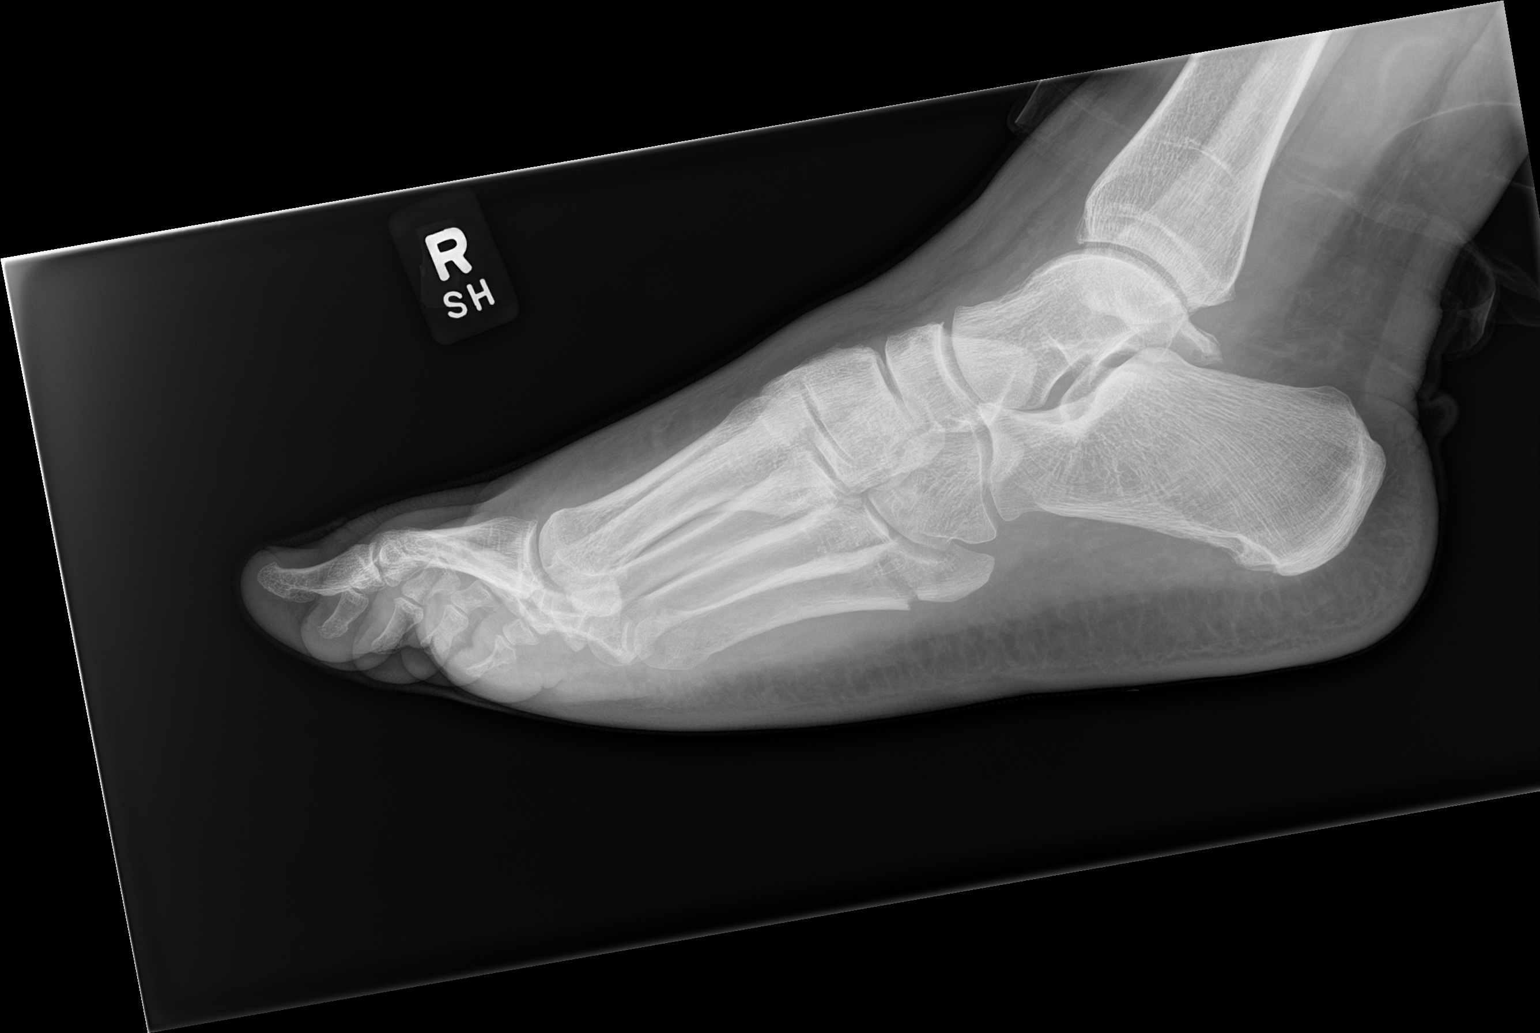

[3 of 3 positions shown; findings below may reference images not displayed]

FINDINGS: There is a mildly comminuted and displaced fracture of the base of
the right fifth metatarsal bone. Associated soft tissue swelling.
IMPRESSION: Mildly comminuted and displaced fracture of the base of the right
fifth metatarsal bone.

## 2023-04-11 ENCOUNTER — Other Ambulatory Visit: Payer: Self-pay

## 2023-04-11 ENCOUNTER — Emergency Department (HOSPITAL_BASED_OUTPATIENT_CLINIC_OR_DEPARTMENT_OTHER)
Admission: EM | Admit: 2023-04-11 | Discharge: 2023-04-11 | Disposition: A | Discharge status: Home / Self Care | Payer: Self-pay

## 2023-04-11 DIAGNOSIS — K029 Dental caries, unspecified: Secondary | ICD-10-CM | POA: Insufficient documentation

## 2023-04-11 MED ORDER — OXYCODONE-ACETAMINOPHEN 5-325 MG PO TABS
1.0000 | ORAL_TABLET | Freq: Once | ORAL | Status: AC
  Administered 2023-04-11: 1 via ORAL
  Filled 2023-04-11: qty 1

## 2023-04-11 MED ORDER — AMOXICILLIN-POT CLAVULANATE 875-125 MG PO TABS: 1.0000 | ORAL_TABLET | Freq: Two times a day (BID) | ORAL | 0 refills | Status: AC

## 2023-04-11 NOTE — Discharge Instructions (Signed)
As discussed, please follow-up with dentist soon as possible.  Return immediately if you develop fevers, chills, difficulty swallowing, difficulty breathing, your tongue swells, develop chest pain, shortness of breath, abdominal pain or any new or worsening symptoms that are concerning to you.

## 2023-04-11 NOTE — ED Provider Notes (Signed)
[place] EMERGENCY DEPARTMENT AT [facility] Provider Note   CSN: [id number] Arrival date & time: 04/11/23  1527     History  Chief Complaint  Patient presents with   Dental Pain    Tony Trujillo is a 37 y.o. male.  37 year old male present emergency department with dental pain.  Said intermittent symptoms for the past several months.  Does not have insurance.  Had worsening pain over the past 4 days.  No painful difficulty swallowing, no painful difficulty breathing.  No reported fevers.  Voice is normal per patient.   Dental Pain      Home Medications Prior to Admission medications   Medication Sig Start Date End Date Taking? Authorizing Provider  ibuprofen (ADVIL,MOTRIN) 200 MG tablet Take 200-600 mg by mouth every 6 (six) hours as needed for headache, mild pain or moderate pain.     [provider]  oxyCODONE (ROXICODONE) 5 MG immediate release tablet Take 1-2 tablets (5-10 mg total) by mouth every 4 (four) hours as needed for severe pain. 01/17/21   Pricilla Loveless, MD      Allergies    Patient has no known allergies.    Review of Systems   Review of Systems  Physical Exam Updated Vital Signs BP (!) 145/72 (BP Location: Right Arm)   Pulse 85   Temp 98.3 F (36.8 C)   Resp 16   Ht 6\' 1"  (1.854 m)   Wt 131.1 kg   SpO2 99%   BMI 38.13 kg/m  Physical Exam Vitals and nursing note reviewed.  HENT:     Head: Normocephalic.     Nose: Nose normal.     Mouth/Throat:     Comments: Poor dentition.  Appears to have cracked tooth on tooth #31.  No periapical abscess.  Floor of mouth is soft.  Uvula midline.  No trismus Eyes:     Conjunctiva/sclera: Conjunctivae normal.  Cardiovascular:     Rate and Rhythm: Normal rate.  Pulmonary:     Effort: Pulmonary effort is normal.  Musculoskeletal:        General: Normal range of motion.  Neurological:     Mental Status: He is alert and oriented to person, place, and time.  Psychiatric:        Mood  and Affect: Mood normal.        Behavior: Behavior normal.     ED Results / Procedures / Treatments   Labs (all labs ordered are listed, but only abnormal results are displayed) Labs Reviewed - No data to display  EKG None  Radiology No results found.  Procedures Procedures    Medications Ordered in ED Medications - No data to display  ED Course/ Medical Decision Making/ A&P                                 Medical Decision Making Well-appearing 37 year old male presenting emergency department for dental pain.  Is afebrile vital signs reassuring.  Does not appear to have obvious periapical abscess, but does have very poor dentition.  No evidence of Ludwig's angina.  No evidence of.  Tonsillar/retropharyngeal abscess on exam.  Will give a single dose of narcotic pain medication here for breakthrough pain.  Discussed follow-up with dentist and discussing Payment options and that that is definitive treatment.  Voiced understanding.  Will discharge on Augmentin.          Final Clinical Impression(s) / ED  Diagnoses Final diagnoses:  None    Rx / DC Orders ED Discharge Orders     None         Coral Spikes, DO 04/11/23 1809

## 2023-04-11 NOTE — ED Notes (Signed)
Discharge instructions, prescription, pain management, and follow up care reviewed and explained, pt verbalized understanding and had no further questions on d/c. Pt caox4, ambulatory, NAD on d/c.

## 2023-04-11 NOTE — ED Triage Notes (Signed)
Day two of swelling to right sided tooth. 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 Known cracked tooth. Accompanied by HA. Eating and drinking well. No Cp. No SOB. Tylenol at home.  No HX reported. No daily meds.
